# Patient Record
Sex: Male | Born: 1975 | Hispanic: No | Marital: Married | Smoking: Former smoker
Health system: Southern US, Community
[De-identification: ages and names within clinical notes are randomized; demographics above are authoritative.]

## PROBLEM LIST (undated history)

## (undated) DIAGNOSIS — R059 Cough, unspecified: Secondary | ICD-10-CM

## (undated) DIAGNOSIS — E785 Hyperlipidemia, unspecified: Secondary | ICD-10-CM

## (undated) DIAGNOSIS — E559 Vitamin D deficiency, unspecified: Secondary | ICD-10-CM

## (undated) DIAGNOSIS — R05 Cough: Secondary | ICD-10-CM

## (undated) HISTORY — DX: Cough: R05

## (undated) HISTORY — PX: CHOLECYSTECTOMY: SHX55

## (undated) HISTORY — DX: Hyperlipidemia, unspecified: E78.5

## (undated) HISTORY — PX: NOSE SURGERY: SHX723

## (undated) HISTORY — DX: Cough, unspecified: R05.9

## (undated) HISTORY — DX: Vitamin D deficiency, unspecified: E55.9

## (undated) HISTORY — PX: OTHER SURGICAL HISTORY: SHX169

---

## 2013-01-03 ENCOUNTER — Telehealth: Payer: Self-pay | Admitting: Critical Care Medicine

## 2013-01-03 NOTE — Telephone Encounter (Signed)
Dr. Delford Field, Dr. Susie Cassette would like to make an appt next wk for her husband in the [place] office with you. Did you want this appt with you because your aren't in [place] next week. If so, I can see if they can come the following week, if this is ok with you, as you will be in office then and openings available.

## 2013-01-03 NOTE — Telephone Encounter (Signed)
Called, spoke with Dr. Susie Cassette.  We have scheduled pt to see Dr. Delford Field on July 23 at 1:45 in [place].  She will ask him to arrive at 1:30.  Nothing further needed at this time.

## 2013-01-03 NOTE — Telephone Encounter (Signed)
Week after next is fine

## 2013-01-18 ENCOUNTER — Ambulatory Visit (INDEPENDENT_AMBULATORY_CARE_PROVIDER_SITE_OTHER)
Admission: RE | Admit: 2013-01-18 | Discharge: 2013-01-18 | Disposition: A | Discharge status: Home / Self Care | Payer: Managed Care, Other (non HMO) | Source: Ambulatory Visit | Attending: Critical Care Medicine | Admitting: Critical Care Medicine

## 2013-01-18 ENCOUNTER — Encounter: Payer: Self-pay | Admitting: Critical Care Medicine

## 2013-01-18 ENCOUNTER — Ambulatory Visit (INDEPENDENT_AMBULATORY_CARE_PROVIDER_SITE_OTHER): Payer: Managed Care, Other (non HMO) | Admitting: Critical Care Medicine

## 2013-01-18 VITALS — BP 106/60 | HR 57 | Temp 98.0°F | Ht 72.0 in | Wt 174.4 lb

## 2013-01-18 DIAGNOSIS — R05 Cough: Secondary | ICD-10-CM

## 2013-01-18 DIAGNOSIS — R059 Cough, unspecified: Secondary | ICD-10-CM

## 2013-01-18 MED ORDER — BENZONATATE 100 MG PO CAPS: ORAL_CAPSULE | ORAL | Status: AC

## 2013-01-18 MED ORDER — PREDNISONE 10 MG PO TABS: ORAL_TABLET | ORAL | Status: DC

## 2013-01-18 MED ORDER — CHLORPHENIRAMINE MALEATE 4 MG PO TABS: ORAL_TABLET | ORAL | Status: AC

## 2013-01-18 MED ORDER — HYDROCODONE-HOMATROPINE 5-1.5 MG/5ML PO SYRP: 5.0000 mL | ORAL_SOLUTION | Freq: Four times a day (QID) | ORAL | Status: AC | PRN

## 2013-01-18 MED ORDER — MOMETASONE FUROATE 50 MCG/ACT NA SUSP: 2.0000 | Freq: Every day | NASAL | Status: AC

## 2013-01-18 MED ORDER — OMEPRAZOLE 20 MG PO CPDR: 20.0000 mg | DELAYED_RELEASE_CAPSULE | Freq: Every day | ORAL | Status: DC

## 2013-01-18 NOTE — Progress Notes (Signed)
HPI Comments: Pt presents as new consult for chronic cough. States the cough began several years ago and is often associated with drinking cold drinks or ice and leads to episodes of productive cough with low grade fever.  He states he has burping and sore throat after eating. During these episodes he coughs a lot, has nasal drainage with yellow colored mucus, has chest congestion, and occational sneezing.  He states he has burping and sore throat after eating. Denies chest pain, tightness and SOB with cough. He has a PMH of tonsillectomy and nasal septum surgery and pertusis (2012). He has a history of smoking in college (one cigarette every 2-3 days) and tavels frequently and is on the phone a lot with his job. He has a family history of asthma (mother, brother), but denies cancer, allergic rhinitis, and pulmonary disease.     Cough This is a recurrent problem. The current episode started more than 1 year ago. The problem has been unchanged. The cough is non-productive (occ prod yellow mucus). Associated symptoms include ear congestion, nasal congestion, rhinorrhea and a sore throat. Pertinent negatives include no chest pain, chills, ear pain, fever, headaches, heartburn, hemoptysis, postnasal drip, rash, shortness of breath, sweats, weight loss or wheezing. Associated symptoms comments: Burping worse after meals.  . The symptoms are aggravated by other. Risk factors for lung disease include smoking/tobacco exposure and travel. He has tried OTC cough suppressant and rest for the symptoms. The treatment provided moderate relief. There is no history of asthma, bronchiectasis, bronchitis, COPD, emphysema, environmental allergies or pneumonia. zpak 1 month had fever/sore throat this made cough worse and zpak helped   Past Medical History  Diagnosis Date  . Hyperlipidemia   . Cough   . Vitamin D deficiency      Family History  Problem Relation Age of Onset  . Asthma Mother   . Heart disease Maternal  Grandfather      History   Social History  . Marital Status: Married    Spouse Name: N/A    Number of Children: N/A  . Years of Education: N/A   Occupational History  . Not on file.   Social History Main Topics  . Smoking status: Former Smoker -- 0.10 packs/day for 2 years    Types: Cigarettes    Quit date: 06/30/2007  . Smokeless tobacco: Never Used     Comment: social smoker x 2 years.  Would smoking 1-2 every couple days.  . Alcohol Use: Yes     Comment: 1-2 times per week  . Drug Use: Not on file  . Sexually Active: Not on file   Other Topics Concern  . Not on file   Social History Narrative  . No narrative on file     Allergies  Allergen Reactions  . Contrast Media (Iodinated Diagnostic Agents)     vomiting     No outpatient prescriptions prior to visit.   No facility-administered medications prior to visit.     Review of Systems  Constitutional: Negative for fever, chills and weight loss.  HENT: Positive for sore throat and rhinorrhea. Negative for ear pain and postnasal drip.   Respiratory: Positive for cough. Negative for hemoptysis, shortness of breath and wheezing.   Cardiovascular: Negative for chest pain.  Gastrointestinal: Negative for heartburn.  Skin: Negative for rash.  Allergic/Immunologic: Negative for environmental allergies.  Neurological: Negative for headaches.    Physical Exam BP 106/60  Pulse 57  Temp(Src) 98 F (36.7 C) (Oral)  Ht  6' (1.829 m)  Wt 174 lb 6.4 oz (79.107 kg)  BMI 23.65 kg/m2  SpO2 95%  GENERAL: The patient is a well-developed, well-nourished male in no apparent distress. He is alert and oriented x3. HEENT: Head is normocephalic and atraumatic. Extraocular muscles are intact. Pupils are equal, round, and reactive to light and accommodation. Nares bilateral erythema and edema, no purulence. Mouth is well hydrated and without lesions. Mucous membranes are moist. Posterior pharynx clear of any exudate or  lesions. ++post nasal drip, erythematous post pharynx NECK: Supple. No carotid bruits.  No lymphadenopathy or thyromegaly. LUNGS: Clear to auscultation. HEART: Regular rate and rhythm without murmur.  ABDOMEN: Soft, nontender, and nondistended.  Positive bowel sounds.  No hepatosplenomegaly was noted.  EXTREMITIES: Without any cyanosis, clubbing, rash, lesions or edema. No pitting edema present. NEUROLOGIC: Cranial nerves II through XII are grossly intact.  PSYCHIATRIC: normal affect SKIN: No rashes present  Dg Chest 2 View  01/18/2013   *RADIOLOGY REPORT*  Clinical Data: Chronic cough, shortness of breath, former smoker  CHEST - 2 VIEW  Comparison: None  Findings: Normal heart size, mediastinal contours, and pulmonary vascularity. Lungs appear minimally hyperinflated but clear. Question tiny calcified granuloma lower right lung. No pleural effusion or pneumothorax. Bones unremarkable.  IMPRESSION: No acute abnormalities.   Original Report Authenticated By: Ulyses Southward, M.D.    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 associated reflux disease with postnasal drip syndrome due to allergic rhinitis Doubt significant primary lung disease Plan Take prednisone 10 mg  4 a day and then down by one every 2 days till off  orally for tapered use, take with food Start the Gastroesophageal Reflux diet and omeprazole 20mg  before meal breakfast daily  Cough Protocol -  Take Hydromet 5ml three times a day for cough then benzonatate   For allergic rhinitis: Take  Chloriphenirimine (Chlor trimeton)  8 mg by mouth at bedtime for  allergic rhinitis  Take Nasonex 2 sprays in each nostril every day for allergic rhinitis   Return 1 month    Updated Medication List Outpatient Encounter Prescriptions as of 01/18/2013  Medication Sig Dispense Refill  . fenofibrate (TRICOR) 145 MG tablet Take 145 mg by mouth daily.      . benzonatate (TESSALON) 100 MG capsule 1-2 every 4 hours per  cough protocol  90 capsule  4  . chlorpheniramine (CHLOR-TRIMETON) 4 MG tablet Use 8mg  nightly  30 tablet  0  . HYDROcodone-homatropine (HYCODAN) 5-1.5 MG/5ML syrup Take 5 mLs by mouth every 6 (six) hours as needed for cough.  240 mL  0  . mometasone (NASONEX) 50 MCG/ACT nasal spray Place 2 sprays into the nose daily.  17 g  11  . omeprazole (PRILOSEC) 20 MG capsule Take 1 capsule (20 mg total) by mouth daily.  30 capsule  11  . predniSONE (DELTASONE) 10 MG tablet Take 4 for two days three for two days two for two days one for two days  20 tablet  0   No facility-administered encounter medications on file as of 01/18/2013.

## 2013-01-18 NOTE — Patient Instructions (Addendum)
Chest xray will call with results  Take prednisone 10 mg  4 a day and then down by one every 2 days till off  orally for tapered use, take with food Start the Gastroesophageal Reflux diet and omeprazole 20mg  before meal breakfast daily  Cough Protocol -  Take Hydromet 5ml three times a day for cough then benzonatate   For allergic rhinitis: Take  Chloriphenirimine (Chlor trimeton)  8 mg by mouth at bedtime for  allergic rhinitis  Take Nasonex 2 sprays in each nostril every day for allergic rhinitis   Return 1 month

## 2013-01-18 NOTE — Progress Notes (Signed)
  Subjective:    Patient ID: Ricky Larson, male    DOB: [birth year], 37 y.o.   MRN: [id number]  HPI    Review of Systems  Constitutional: Positive for fever. Negative for chills, diaphoresis, activity change, appetite change, fatigue and unexpected weight change.  HENT: Positive for ear pain, congestion, sore throat, rhinorrhea and sneezing. Negative for hearing loss, nosebleeds, facial swelling, mouth sores, trouble swallowing, neck pain, neck stiffness, dental problem, voice change, postnasal drip, sinus pressure, tinnitus and ear discharge.   Eyes: Negative for photophobia, discharge, itching and visual disturbance.  Respiratory: Positive for cough. Negative for apnea, choking, chest tightness, shortness of breath, wheezing and stridor.   Cardiovascular: Negative for chest pain, palpitations and leg swelling.  Gastrointestinal: Negative for nausea, vomiting, abdominal pain, constipation, blood in stool and abdominal distention.  Genitourinary: Positive for flank pain. Negative for dysuria, urgency, frequency, hematuria, decreased urine volume and difficulty urinating.  Musculoskeletal: Negative for myalgias, back pain, joint swelling, arthralgias and gait problem.  Skin: Negative for color change, pallor and rash.  Neurological: Positive for headaches. Negative for dizziness, tremors, seizures, syncope, speech difficulty, weakness, light-headedness and numbness.  Hematological: Negative for adenopathy. Does not bruise/bleed easily.  Psychiatric/Behavioral: Negative for confusion, sleep disturbance and agitation. The patient is not nervous/anxious.        Objective:   Physical Exam        Assessment & Plan:

## 2013-01-18 NOTE — Progress Notes (Signed)
Quick Note:  Called, spoke with pt's wife. Informed her of cxr results per Dr. Delford Field. She verbalized understanding. ______

## 2013-01-18 NOTE — Assessment & Plan Note (Signed)
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 associated reflux disease with postnasal drip syndrome due to allergic rhinitis Doubt significant primary lung disease Plan Take prednisone 10 mg  4 a day and then down by one every 2 days till off  orally for tapered use, take with food Start the Gastroesophageal Reflux diet and omeprazole 20mg  before meal breakfast daily  Cough Protocol -  Take Hydromet 5ml three times a day for cough then benzonatate   For allergic rhinitis: Take  Chloriphenirimine (Chlor trimeton)  8 mg by mouth at bedtime for  allergic rhinitis  Take Nasonex 2 sprays in each nostril every day for allergic rhinitis   Return 1 month

## 2013-01-18 NOTE — Progress Notes (Signed)
Quick Note:  Notify the patient that the Xray is stable and no pneumonia No change in medications are recommended. Continue current meds as prescribed at last office visit ______ 

## 2013-02-24 ENCOUNTER — Ambulatory Visit: Payer: Managed Care, Other (non HMO) | Admitting: Critical Care Medicine

## 2013-11-23 ENCOUNTER — Other Ambulatory Visit: Payer: Self-pay | Admitting: Internal Medicine

## 2013-11-23 LAB — LIPID PANEL
CHOL/HDL RATIO: 4.3 ratio
CHOLESTEROL: 195 mg/dL (ref 0–200)
HDL: 45 mg/dL (ref >39)
LDL Cholesterol: 128 mg/dL — ABNORMAL HIGH (ref 0–99)
TRIGLYCERIDES: 110 mg/dL (ref <150)
VLDL: 22 mg/dL (ref 0–40)

## 2013-11-23 LAB — COMPREHENSIVE METABOLIC PANEL
ALT: 74 U/L — ABNORMAL HIGH (ref 0–53)
AST: 38 U/L — ABNORMAL HIGH (ref 0–37)
Albumin: 4.7 g/dL (ref 3.5–5.2)
Alkaline Phosphatase: 72 U/L (ref 39–117)
BILIRUBIN TOTAL: 0.6 mg/dL (ref 0.2–1.2)
BUN: 13 mg/dL (ref 6–23)
CALCIUM: 9.3 mg/dL (ref 8.4–10.5)
CHLORIDE: 108 meq/L (ref 96–112)
CO2: 24 meq/L (ref 19–32)
CREATININE: 0.83 mg/dL (ref 0.50–1.35)
GLUCOSE: 89 mg/dL (ref 70–99)
Potassium: 4.4 mEq/L (ref 3.5–5.3)
Sodium: 140 mEq/L (ref 135–145)
TOTAL PROTEIN: 6.7 g/dL (ref 6.0–8.3)

## 2013-11-23 LAB — CBC
HCT: 44.4 % (ref 39.0–52.0)
HEMOGLOBIN: 15.3 g/dL (ref 13.0–17.0)
MCH: 26.5 pg (ref 26.0–34.0)
MCHC: 34.5 g/dL (ref 30.0–36.0)
MCV: 76.9 fL — AB (ref 78.0–100.0)
Platelets: 292 10*3/uL (ref 150–400)
RBC: 5.77 MIL/uL (ref 4.22–5.81)
RDW: 14.3 % (ref 11.5–15.5)
WBC: 5.6 10*3/uL (ref 4.0–10.5)

## 2013-11-23 LAB — HEMOGLOBIN A1C
HEMOGLOBIN A1C: 5.8 % — AB (ref <5.7)
Mean Plasma Glucose: 120 mg/dL — ABNORMAL HIGH (ref <117)

## 2013-11-24 LAB — ABO AND RH: Rh Type: POSITIVE

## 2013-11-24 LAB — VITAMIN B12: VITAMIN B 12: 346 pg/mL (ref 211–911)

## 2013-11-24 LAB — VITAMIN D 25 HYDROXY (VIT D DEFICIENCY, FRACTURES): VIT D 25 HYDROXY: 27 ng/mL — AB (ref 30–89)

## 2013-11-24 LAB — TSH: TSH: 3.312 u[IU]/mL (ref 0.350–4.500)

## 2014-01-09 ENCOUNTER — Encounter (HOSPITAL_COMMUNITY): Payer: Self-pay | Admitting: Emergency Medicine

## 2014-01-09 ENCOUNTER — Emergency Department (HOSPITAL_COMMUNITY)
Admission: EM | Admit: 2014-01-09 | Discharge: 2014-01-09 | Disposition: A | Discharge status: Home / Self Care | Payer: BC Managed Care – PPO | Source: Home / Self Care

## 2014-01-09 ENCOUNTER — Emergency Department (INDEPENDENT_AMBULATORY_CARE_PROVIDER_SITE_OTHER): Payer: BC Managed Care – PPO

## 2014-01-09 DIAGNOSIS — IMO0001 Reserved for inherently not codable concepts without codable children: Secondary | ICD-10-CM

## 2014-01-09 DIAGNOSIS — R059 Cough, unspecified: Secondary | ICD-10-CM

## 2014-01-09 DIAGNOSIS — R05 Cough: Secondary | ICD-10-CM

## 2014-01-09 DIAGNOSIS — M7918 Myalgia, other site: Secondary | ICD-10-CM

## 2014-01-09 LAB — POCT I-STAT, CHEM 8
BUN: 11 mg/dL (ref 6–23)
CREATININE: 1 mg/dL (ref 0.50–1.35)
Calcium, Ion: 1.18 mmol/L (ref 1.12–1.23)
Chloride: 107 mEq/L (ref 96–112)
Glucose, Bld: 77 mg/dL (ref 70–99)
HCT: 50 % (ref 39.0–52.0)
HEMOGLOBIN: 17 g/dL (ref 13.0–17.0)
POTASSIUM: 4.2 meq/L (ref 3.7–5.3)
SODIUM: 140 meq/L (ref 137–147)
TCO2: 25 mmol/L (ref 0–100)

## 2014-01-09 NOTE — Discharge Instructions (Signed)
Your chest pain is likely musculoskeletal in nature and is not related to your heart Try taking ibuprofen 600mg  every 6 hours for several days to see if it resolves the pain Please follow up with your regular doctor to get a ppd as well as a routine check up Please come back if there are any further concerns.  Have a great night.

## 2014-01-09 NOTE — ED Notes (Signed)
Pt c/o intermittent right side chest pain that last 2-3 minutes x 1 month yest has become more frequent; travels frequently due to his job Just came from [place] 2 weeks ago Sx also include: HA Denies weakness, diaphoresis Alert and talking in complete sentences w/no signs of acute distress.

## 2014-01-09 NOTE — ED Provider Notes (Signed)
CSN: [id number]     Arrival date & time 01/09/14  1904 History   None    Chief Complaint  Patient presents with  . Chest Pain   (Consider location/radiation/quality/duration/timing/severity/associated sxs/prior Treatment) HPI  CP: R sided. On and off for a couple of years. 3 occurances for the last day. Lasts for 2-3 min. 5/10 pain. Does not happen when asleep. Not tied to any particular activity - random. No alleviating factors. Currently taking tricor for cholesterol. Associated w/ HA. Denies radiation, diaphoresis, SOB, palpitations. 2 wks ago returned from a trip to [place]. Denies leg swelling.   Cough: ongoing. Off an on. Pt is prescribed a Zpack every couple of months w/ some benefit. Pt travels internationally on a very regular basis. No h/o TB exposure. Denies tobacco use, fevers, night sweats, unintentional wt loss.    Past Medical History  Diagnosis Date  . Hyperlipidemia   . Cough   . Vitamin D deficiency    Past Surgical History  Procedure Laterality Date  . Cholecystectomy    . Tonsilectomy    . Nose surgery     Family History  Problem Relation Age of Onset  . Asthma Mother   . Heart disease Maternal Grandfather    History  Substance Use Topics  . Smoking status: Former Smoker -- 0.10 packs/day for 2 years    Types: Cigarettes    Quit date: 06/30/2007  . Smokeless tobacco: Never Used     Comment: social smoker x 2 years.  Would smoking 1-2 every couple days.  . Alcohol Use: Yes     Comment: 1-2 times per week    Review of Systems Per HPI w/ all other systems negative Allergies  Contrast media  Home Medications   Prior to Admission medications   Medication Sig Start Date End Date Taking? Authorizing Provider  benzonatate (TESSALON) 100 MG capsule 1-2 every 4 hours per cough protocol 01/18/13   Storm Frisk, MD  chlorpheniramine (CHLOR-TRIMETON) 4 MG tablet Use 8mg  nightly 01/18/13   Storm Frisk, MD  fenofibrate (TRICOR) 145 MG tablet Take 145 mg  by mouth daily.    Historical Provider, MD  HYDROcodone-homatropine (HYCODAN) 5-1.5 MG/5ML syrup Take 5 mLs by mouth every 6 (six) hours as needed for cough. 01/18/13   Storm Frisk, MD  mometasone (NASONEX) 50 MCG/ACT nasal spray Place 2 sprays into the nose daily. 01/18/13   Storm Frisk, MD  omeprazole (PRILOSEC) 20 MG capsule Take 1 capsule (20 mg total) by mouth daily. 01/18/13   Storm Frisk, MD  predniSONE (DELTASONE) 10 MG tablet Take 4 for two days three for two days two for two days one for two days 01/18/13   Storm Frisk, MD   BP 105/74  Pulse 63  Temp(Src) 97.8 F (36.6 C) (Oral)  Resp 18  SpO2 100% Physical Exam  Constitutional: He appears well-developed and well-nourished. No distress.  HENT:  Head: Normocephalic and atraumatic.  Eyes: EOM are normal. Pupils are equal, round, and reactive to light.  Neck: Normal range of motion. Neck supple. No JVD present.  Cardiovascular: Normal rate, regular rhythm, normal heart sounds and intact distal pulses.  Exam reveals no gallop and no friction rub.   No murmur heard. Pulmonary/Chest: Effort normal and breath sounds normal. No stridor. No respiratory distress. He has no wheezes. He has no rales. He exhibits tenderness.  Chest wall ttp along the mid clavicular line of the 4-5th ribs  Abdominal: Soft. He exhibits  no distension.  Musculoskeletal: Normal range of motion. He exhibits tenderness. He exhibits no edema.  Neurological: He is alert. He exhibits normal muscle tone.  Skin: Skin is warm. No rash noted. He is not diaphoretic.  Psychiatric: He has a normal mood and affect. His behavior is normal. Judgment and thought content normal.    ED Course  Procedures (including critical care time) Labs Review Labs Reviewed  POCT I-STAT, CHEM 8    Imaging Review Dg Chest 2 View  01/09/2014   CLINICAL DATA:  Right-sided chest pain.  EXAM: CHEST  2 VIEW  COMPARISON:  None.  FINDINGS: The heart size and mediastinal  contours are within normal limits. Both lungs are clear. The visualized skeletal structures are unremarkable.  IMPRESSION: No active cardiopulmonary disease.   Electronically Signed   By: Britta MccreedySusan  Turner M.D.   On: 01/09/2014 21:05     MDM   1. Musculoskeletal pain   2. Cough    CP: No sign of cardiac etiology of pain and low risk factors. Likely MSK pain as reproducible on palpation. Unlikely DVT/PE given negative D-Dimer and no leg swelling or persistence of pain.  Start NSAIDs, stretching.  Cough: ongoing and chronic condition for pt. Likely some reactive airway component vs recurring infectious bronchitis given frequency of international travel and change of climates and viral exposures. Low likelyhood of malignancy or TB though pt to get ppd tomorrow at PCP. May be related to GERD adn should try PPI. Defer to PCP.   Precautions given and all questions answered   Shelly Flattenavid Ziyanna Tolin, MD Family Medicine 01/09/2014, 9:42 PM      Thora Lanceavid J Merrill, MD 01/09/14 2142

## 2014-01-10 ENCOUNTER — Ambulatory Visit: Payer: BC Managed Care – PPO | Attending: Cardiology | Admitting: Cardiology

## 2014-01-10 ENCOUNTER — Other Ambulatory Visit: Payer: Self-pay

## 2014-01-10 ENCOUNTER — Ambulatory Visit (HOSPITAL_COMMUNITY)
Admission: RE | Admit: 2014-01-10 | Discharge: 2014-01-10 | Disposition: A | Discharge status: Home / Self Care | Payer: BC Managed Care – PPO | Source: Ambulatory Visit | Attending: Cardiology | Admitting: Cardiology

## 2014-01-10 ENCOUNTER — Encounter: Payer: Self-pay | Admitting: Cardiology

## 2014-01-10 VITALS — BP 102/64 | HR 65 | Temp 98.2°F | Resp 16

## 2014-01-10 DIAGNOSIS — R079 Chest pain, unspecified: Secondary | ICD-10-CM | POA: Insufficient documentation

## 2014-01-10 DIAGNOSIS — Z87891 Personal history of nicotine dependence: Secondary | ICD-10-CM | POA: Insufficient documentation

## 2014-01-10 DIAGNOSIS — R059 Cough, unspecified: Secondary | ICD-10-CM | POA: Insufficient documentation

## 2014-01-10 DIAGNOSIS — R05 Cough: Secondary | ICD-10-CM | POA: Insufficient documentation

## 2014-01-10 DIAGNOSIS — E559 Vitamin D deficiency, unspecified: Secondary | ICD-10-CM | POA: Insufficient documentation

## 2014-01-10 DIAGNOSIS — E785 Hyperlipidemia, unspecified: Secondary | ICD-10-CM | POA: Insufficient documentation

## 2014-01-10 NOTE — Progress Notes (Signed)
HPI [name] is a 38 year old married [place] male who comes today referred by his wife Dr.Abrol for the evaluation of chest pain.  He has had intermittent chest discomfort over the last several years. It is well localized with 2 fingers over the right mid chest just above the nipple. It occurs spontaneously. It does not radiate and is not associated with any other symptoms. It is clearly not exertional.  He has had 2 episodes this discomfort over the last day or so. They're difficult to those listed above and have been associated with a slight dull headache over the left frontotemporal region. No nausea, vomiting, fever, recent insect bite or tick exposure. He has had a slight stiffness in the left side of his neck but that's resolved.  He exercises on a treadmill intermittently. He has no limitations and has no symptoms consistent with ischemic heart disease or angina.  He denies any trauma or recent unusual physical activity or muscle usage.  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 He has a history of diabetes without obesity and his family. He smoked minimally in the past. He is not overweight. There is no history of hypertension.  EKG at urgent care showed normal sinus rhythm with nonspecific ST changes. We repeated one today which shows identical findings.  Past Medical History  Diagnosis Date  . Hyperlipidemia   . Cough   . Vitamin D deficiency     Current Outpatient Prescriptions  Medication Sig Dispense Refill  . benzonatate (TESSALON) 100 MG capsule 1-2 every 4 hours per cough protocol  90 capsule  4  . chlorpheniramine (CHLOR-TRIMETON) 4 MG tablet Use 8mg  nightly  30 tablet  0  . fenofibrate (TRICOR) 145 MG tablet Take 145 mg by mouth daily.      Marland Kitchen HYDROcodone-homatropine (HYCODAN) 5-1.5 MG/5ML syrup Take 5 mLs by mouth every  6 (six) hours as needed for cough.  240 mL  0  . mometasone (NASONEX) 50 MCG/ACT nasal spray Place 2 sprays into the nose daily.  17 g  11  . omeprazole (PRILOSEC) 20 MG capsule Take 1 capsule (20 mg total) by mouth daily.  30 capsule  11  . predniSONE (DELTASONE) 10 MG tablet Take 4 for two days three for two days two for two days one for two days  20 tablet  0   No current facility-administered medications for this visit.    Allergies  Allergen Reactions  . Contrast Media [Iodinated Diagnostic Agents]     vomiting    Family History  Problem Relation Age of Onset  . Asthma Mother   . Heart disease Maternal Grandfather     History   Social History  . Marital Status: Married    Spouse Name: N/A    Number of Children: N/A  . Years of Education: N/A   Occupational History  . Not on file.   Social History Main Topics  . Smoking status: Former Smoker -- 0.10 packs/day for 2 years    Types: Cigarettes    Quit date: 06/30/2007  . Smokeless tobacco: Never Used     Comment: social smoker x 2 years.  Would smoking 1-2 every couple days.  . Alcohol Use: Yes     Comment: 1-2 times per week  . Drug Use: Not on file  . Sexual Activity: Not on file   Other Topics Concern  .  Not on file   Social History Narrative  . No narrative on file    ROS ALL NEGATIVE EXCEPT THOSE NOTED IN HPI  PE  General Appearance: well developed, well nourished in no acute distress HEENT: symmetrical face, PERRLA, good dentition  Neck: no JVD, thyromegaly, or adenopathy, trachea midline Chest: symmetric without deformity, no discrete tenderness Cardiac: PMI non-displaced, RRR, normal S1, S2, no gallop or murmur or rub Lung: clear to ausculation and percussion Vascular: all pulses full without bruits  Abdominal: nondistended, nontender, good bowel sounds, no HSM, no bruits Extremities: no cyanosis, clubbing or edema, no sign of DVT, no varicosities  Skin: normal color, no rashes Neuro: alert and  oriented x 3, non-focal Pysch: normal affect  EKG Normal sinus rhythm, nonspecific ST changes which or mostly inferior. BMET    Component Value Date/Time   NA 140 01/09/2014 2025   K 4.2 01/09/2014 2025   CL 107 01/09/2014 2025   CO2 24 11/23/2013 0901   GLUCOSE 77 01/09/2014 2025   BUN 11 01/09/2014 2025   CREATININE 1.00 01/09/2014 2025   CREATININE 0.83 11/23/2013 0901   CALCIUM 9.3 11/23/2013 0901    Lipid Panel     Component Value Date/Time   CHOL 195 11/23/2013 0901   TRIG 110 11/23/2013 0901   HDL 45 11/23/2013 0901   CHOLHDL 4.3 11/23/2013 0901   VLDL 22 11/23/2013 0901   LDLCALC 128* 11/23/2013 0901    CBC    Component Value Date/Time   WBC 5.6 11/23/2013 0901   RBC 5.77 11/23/2013 0901   HGB 17.0 01/09/2014 2025   HCT 50.0 01/09/2014 2025   PLT 292 11/23/2013 0901   MCV 76.9* 11/23/2013 0901   MCH 26.5 11/23/2013 0901   MCHC 34.5 11/23/2013 0901   RDW 14.3 11/23/2013 0901

## 2014-01-10 NOTE — Progress Notes (Signed)
Patient complain of right sided chest pain that started yesterday Was seen yesterday at the urgent care

## 2014-01-10 NOTE — Assessment & Plan Note (Signed)
This is clearly not cardiac by exam or history. It most likely is mostly musculoskeletal spasm or related to some stress. With the associated headache it could be the latter. Reassurance given with no further cardiac evaluation indicated at this time. I did review cardiac risk factors and encouraged him to exercise 3 hours or more week, watch carbohydrate rich foods especially rice which he enjoys, and to maintain as close to the ideal body weight as possible.

## 2014-01-18 ENCOUNTER — Other Ambulatory Visit: Payer: Self-pay | Admitting: Internal Medicine

## 2014-01-18 MED ORDER — TRAZODONE HCL 50 MG PO TABS: 50.0000 mg | ORAL_TABLET | Freq: Every evening | ORAL | Status: DC | PRN

## 2014-02-20 ENCOUNTER — Other Ambulatory Visit: Payer: Self-pay | Admitting: Internal Medicine

## 2014-02-20 MED ORDER — LOSARTAN POTASSIUM 50 MG PO TABS: 50.0000 mg | ORAL_TABLET | Freq: Two times a day (BID) | ORAL | Status: DC

## 2014-02-20 MED ORDER — FENOFIBRATE 145 MG PO TABS: 145.0000 mg | ORAL_TABLET | Freq: Every day | ORAL | Status: DC

## 2014-02-20 MED ORDER — CLOPIDOGREL BISULFATE 75 MG PO TABS: 75.0000 mg | ORAL_TABLET | Freq: Every day | ORAL | Status: DC

## 2014-02-20 MED ORDER — CARVEDILOL 12.5 MG PO TABS: 12.5000 mg | ORAL_TABLET | Freq: Two times a day (BID) | ORAL | Status: DC

## 2014-02-20 MED ORDER — OMEPRAZOLE 40 MG PO CPDR: 40.0000 mg | DELAYED_RELEASE_CAPSULE | Freq: Every day | ORAL | Status: DC

## 2014-02-20 MED ORDER — AMLODIPINE BESYLATE 5 MG PO TABS: 5.0000 mg | ORAL_TABLET | Freq: Every day | ORAL | Status: DC

## 2014-02-20 MED ORDER — ATORVASTATIN CALCIUM 20 MG PO TABS: 20.0000 mg | ORAL_TABLET | Freq: Every day | ORAL | Status: AC

## 2014-02-20 MED ORDER — METFORMIN HCL 850 MG PO TABS: 850.0000 mg | ORAL_TABLET | Freq: Two times a day (BID) | ORAL | Status: DC

## 2014-02-20 MED ORDER — ATORVASTATIN CALCIUM 20 MG PO TABS: 20.0000 mg | ORAL_TABLET | Freq: Every day | ORAL | Status: DC

## 2014-02-20 MED ORDER — METFORMIN HCL 850 MG PO TABS: 850.0000 mg | ORAL_TABLET | Freq: Two times a day (BID) | ORAL | Status: AC

## 2014-02-21 ENCOUNTER — Other Ambulatory Visit: Payer: Self-pay | Admitting: Otolaryngology

## 2014-02-21 DIAGNOSIS — R05 Cough: Secondary | ICD-10-CM

## 2014-02-21 DIAGNOSIS — R1310 Dysphagia, unspecified: Secondary | ICD-10-CM

## 2014-02-21 DIAGNOSIS — K219 Gastro-esophageal reflux disease without esophagitis: Secondary | ICD-10-CM

## 2014-02-21 DIAGNOSIS — R059 Cough, unspecified: Secondary | ICD-10-CM

## 2014-02-21 DIAGNOSIS — F458 Other somatoform disorders: Secondary | ICD-10-CM

## 2014-03-06 ENCOUNTER — Other Ambulatory Visit: Payer: Self-pay | Admitting: Otolaryngology

## 2014-03-06 ENCOUNTER — Ambulatory Visit
Admission: RE | Admit: 2014-03-06 | Discharge: 2014-03-06 | Disposition: A | Discharge status: Home / Self Care | Payer: BC Managed Care – PPO | Source: Ambulatory Visit | Attending: Otolaryngology | Admitting: Otolaryngology

## 2014-03-06 DIAGNOSIS — R059 Cough, unspecified: Secondary | ICD-10-CM

## 2014-03-06 DIAGNOSIS — K219 Gastro-esophageal reflux disease without esophagitis: Secondary | ICD-10-CM

## 2014-03-06 DIAGNOSIS — R05 Cough: Secondary | ICD-10-CM

## 2014-03-06 DIAGNOSIS — F458 Other somatoform disorders: Secondary | ICD-10-CM

## 2014-03-06 DIAGNOSIS — R1310 Dysphagia, unspecified: Secondary | ICD-10-CM

## 2014-03-18 ENCOUNTER — Other Ambulatory Visit: Payer: Self-pay | Admitting: Internal Medicine

## 2014-03-27 ENCOUNTER — Other Ambulatory Visit: Payer: Self-pay | Admitting: Internal Medicine

## 2014-04-04 ENCOUNTER — Other Ambulatory Visit: Payer: Self-pay | Admitting: Internal Medicine

## 2014-06-12 ENCOUNTER — Other Ambulatory Visit: Payer: Self-pay | Admitting: Internal Medicine

## 2014-06-12 MED ORDER — PREDNISONE 20 MG PO TABS: ORAL_TABLET | ORAL | Status: AC

## 2014-11-05 IMAGING — CR DG CHEST 2V
2 series · 2 of 2 positions shown · non-contrast
Comparison: None

CLINICAL DATA: Chronic cough, shortness of breath, former smoker

CHEST - 2 VIEW

[view not recorded (1 of 2)]
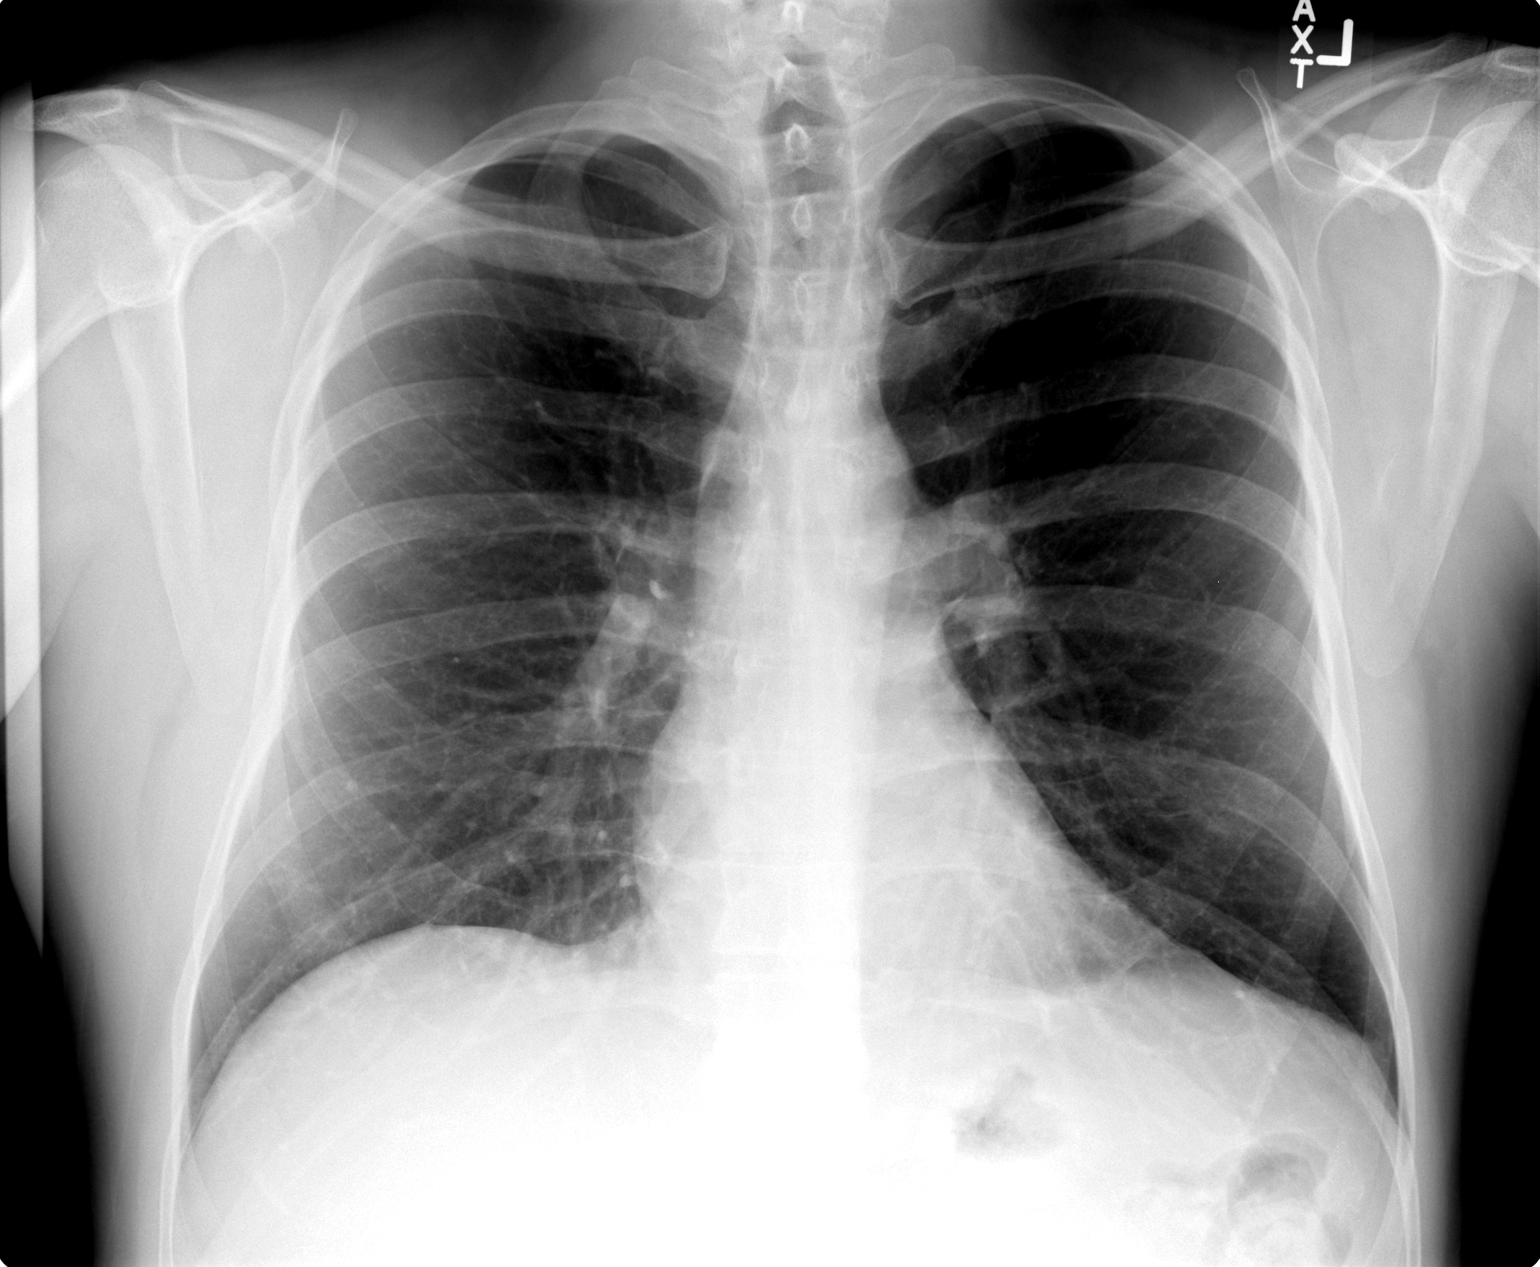

[view not recorded (2 of 2)]
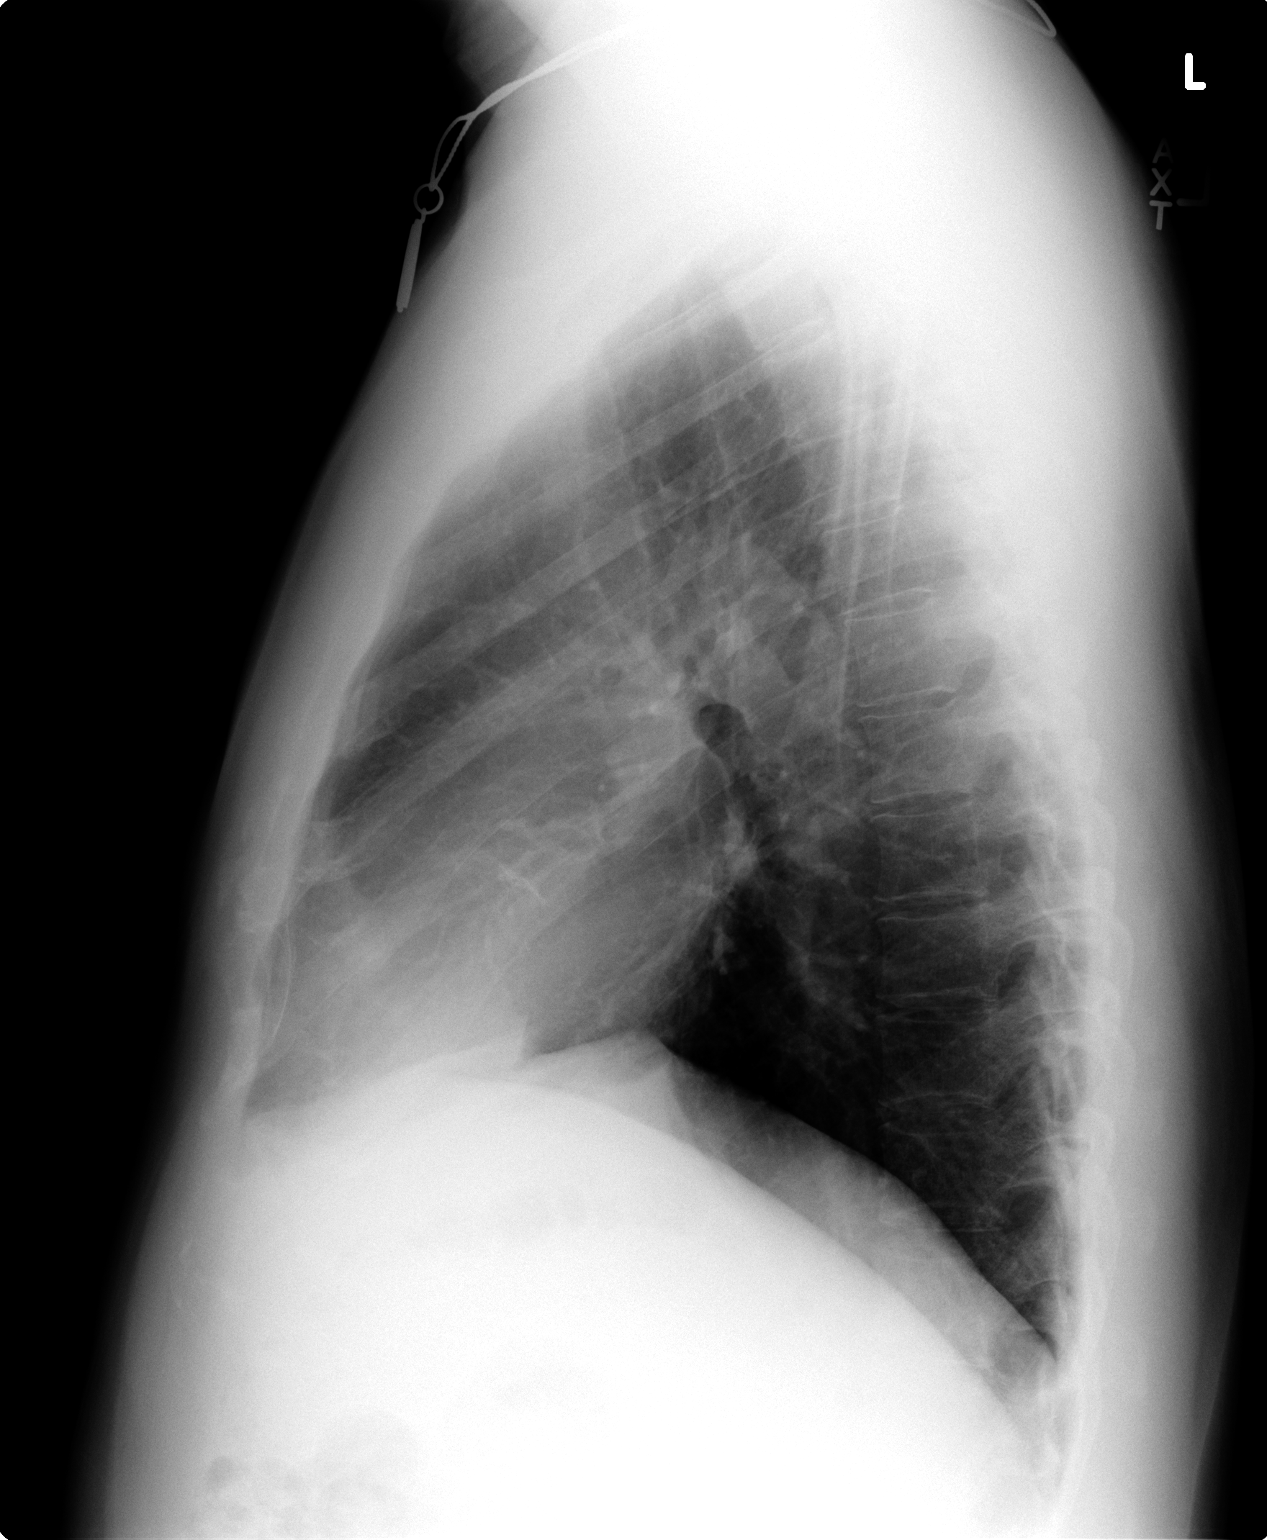

[2 of 2 positions shown; findings below may reference images not displayed]

FINDINGS: Normal heart size, mediastinal contours, and pulmonary vascularity.
Lungs appear minimally hyperinflated but clear.
Question tiny calcified granuloma lower right lung.
No pleural effusion or pneumothorax.
Bones unremarkable.
IMPRESSION: No acute abnormalities.

## 2014-12-27 ENCOUNTER — Other Ambulatory Visit: Payer: Self-pay | Admitting: Internal Medicine

## 2014-12-27 MED ORDER — PANTOPRAZOLE SODIUM 40 MG PO TBEC: 40.0000 mg | DELAYED_RELEASE_TABLET | Freq: Two times a day (BID) | ORAL | Status: AC

## 2014-12-27 MED ORDER — SERTRALINE HCL 25 MG PO TABS
25.0000 mg | ORAL_TABLET | Freq: Every day | ORAL | Status: AC
Start: 2014-12-27 — End: 2015-12-27

## 2014-12-27 MED ORDER — LEVOTHYROXINE SODIUM 100 MCG PO TABS: 100.0000 ug | ORAL_TABLET | Freq: Every day | ORAL | Status: AC

## 2015-07-19 ENCOUNTER — Other Ambulatory Visit: Payer: Self-pay | Admitting: Internal Medicine

## 2015-10-27 IMAGING — CR DG CHEST 2V
2 series · 2 of 2 positions shown · non-contrast
Comparison: None.

CLINICAL DATA: Right-sided chest pain.

EXAM:
CHEST  2 VIEW

[view not recorded (1 of 2)]
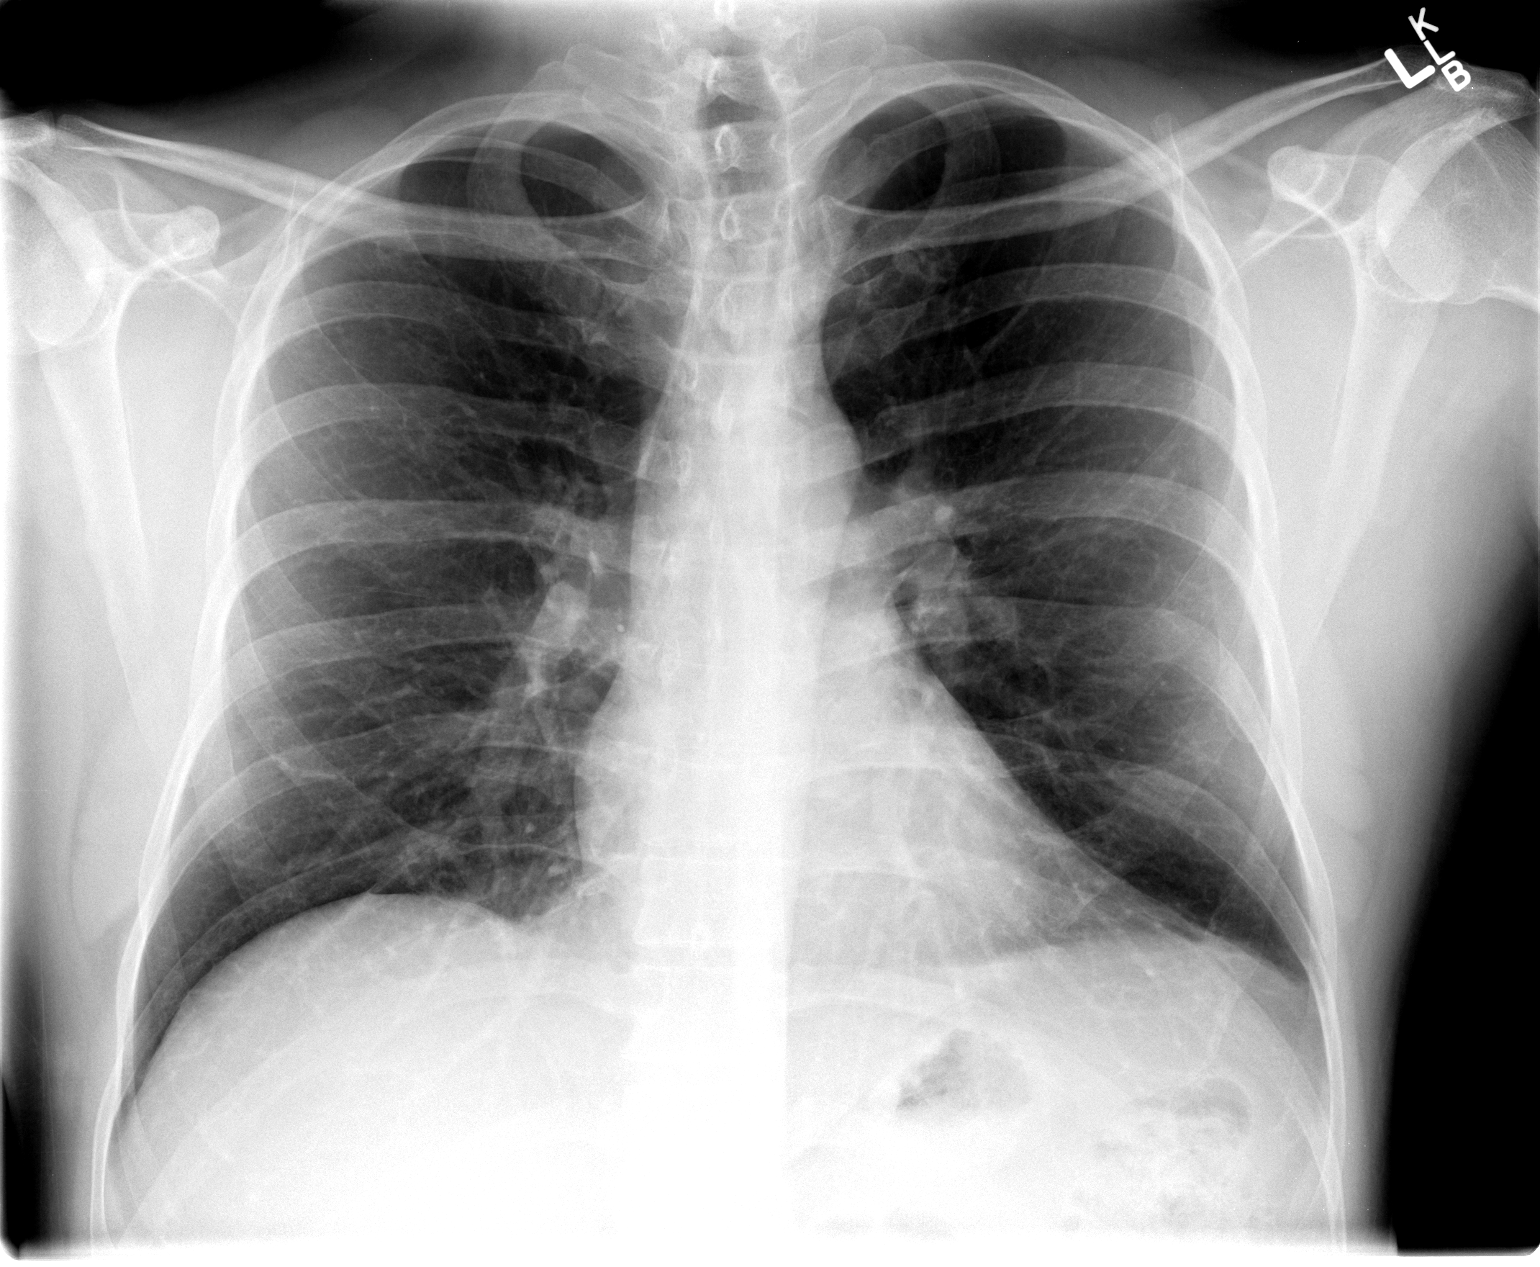

[view not recorded (2 of 2)]
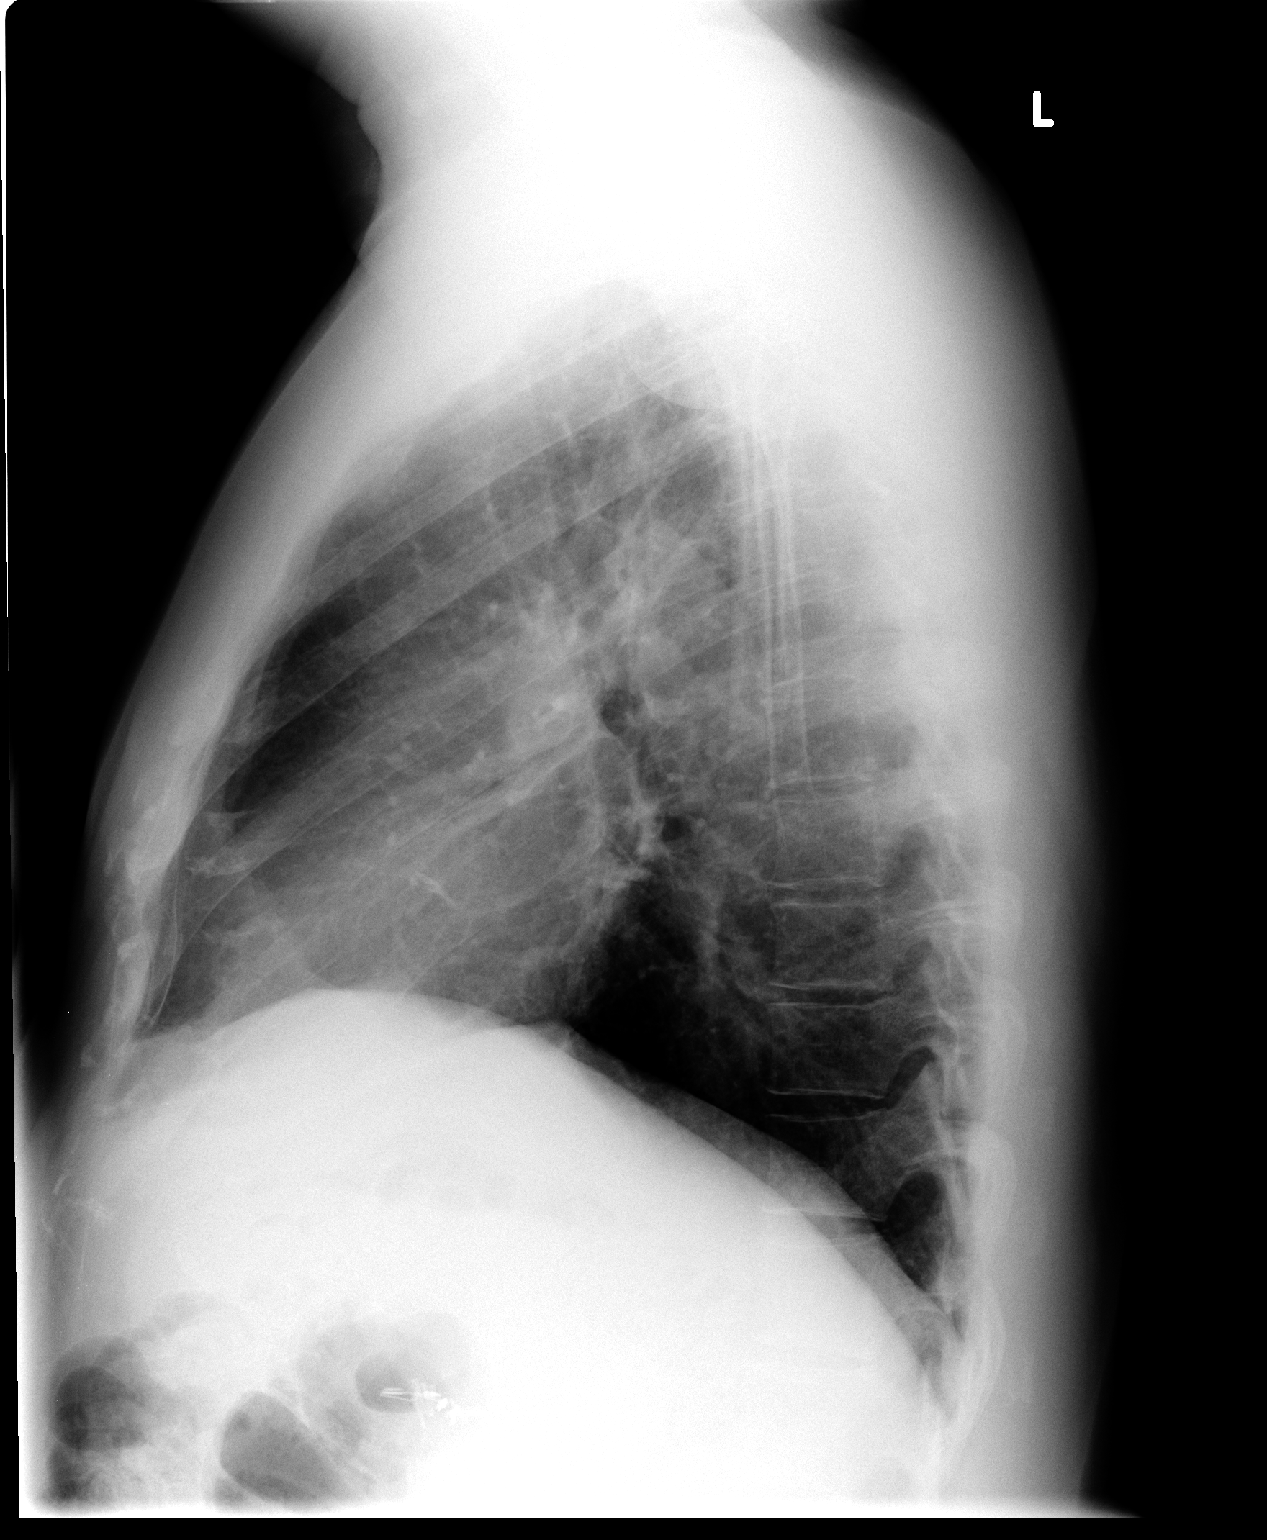

[2 of 2 positions shown; findings below may reference images not displayed]

FINDINGS: The heart size and mediastinal contours are within normal limits.
Both lungs are clear. The visualized skeletal structures are
unremarkable.
IMPRESSION: No active cardiopulmonary disease.

## 2016-02-17 ENCOUNTER — Other Ambulatory Visit: Payer: Self-pay | Admitting: Internal Medicine

## 2016-03-19 ENCOUNTER — Other Ambulatory Visit: Payer: Self-pay | Admitting: Internal Medicine

## 2016-03-19 LAB — LIPID PANEL
CHOL/HDL RATIO: 5.5 ratio — AB (ref <=5.0)
Cholesterol: 226 mg/dL — ABNORMAL HIGH (ref 125–200)
HDL: 41 mg/dL (ref >=40)
LDL CALC: 156 mg/dL — AB (ref <130)
Triglycerides: 145 mg/dL (ref <150)
VLDL: 29 mg/dL (ref <30)

## 2016-03-19 LAB — COMPREHENSIVE METABOLIC PANEL
ALT: 134 U/L — ABNORMAL HIGH (ref 9–46)
AST: 59 U/L — AB (ref 10–40)
Albumin: 4.6 g/dL (ref 3.6–5.1)
Alkaline Phosphatase: 69 U/L (ref 40–115)
BILIRUBIN TOTAL: 0.7 mg/dL (ref 0.2–1.2)
BUN: 11 mg/dL (ref 7–25)
CALCIUM: 9.1 mg/dL (ref 8.6–10.3)
CO2: 21 mmol/L (ref 20–31)
Chloride: 107 mmol/L (ref 98–110)
Creat: 0.85 mg/dL (ref 0.60–1.35)
GLUCOSE: 78 mg/dL (ref 65–99)
POTASSIUM: 4.6 mmol/L (ref 3.5–5.3)
Sodium: 141 mmol/L (ref 135–146)
Total Protein: 6.7 g/dL (ref 6.1–8.1)

## 2016-03-19 LAB — CBC
HEMATOCRIT: 47 % (ref 38.5–50.0)
Hemoglobin: 15.6 g/dL (ref 13.2–17.1)
MCH: 26.5 pg — ABNORMAL LOW (ref 27.0–33.0)
MCHC: 33.2 g/dL (ref 32.0–36.0)
MCV: 79.8 fL — ABNORMAL LOW (ref 80.0–100.0)
MPV: 8.6 fL (ref 7.5–12.5)
Platelets: 243 10*3/uL (ref 140–400)
RBC: 5.89 MIL/uL — ABNORMAL HIGH (ref 4.20–5.80)
RDW: 13.5 % (ref 11.0–15.0)
WBC: 6.6 10*3/uL (ref 3.8–10.8)

## 2016-03-19 LAB — PSA: PSA: 0.5 ng/mL (ref <=4.0)

## 2016-03-19 LAB — TSH: TSH: 2.07 m[IU]/L (ref 0.40–4.50)

## 2016-03-19 LAB — VITAMIN B12: Vitamin B-12: 461 pg/mL (ref 200–1100)

## 2016-03-20 LAB — HEMOGLOBIN A1C
Hgb A1c MFr Bld: 5.5 % (ref <5.7)
Mean Plasma Glucose: 111 mg/dL

## 2016-03-20 LAB — URINALYSIS, COMPLETE
BACTERIA UA: NONE SEEN [HPF]
BILIRUBIN URINE: NEGATIVE
CRYSTALS: NONE SEEN [HPF]
Casts: NONE SEEN [LPF]
Glucose, UA: NEGATIVE
Hgb urine dipstick: NEGATIVE
KETONES UR: NEGATIVE
Leukocytes, UA: NEGATIVE
Nitrite: NEGATIVE
Protein, ur: NEGATIVE
SPECIFIC GRAVITY, URINE: 1.029 (ref 1.001–1.035)
SQUAMOUS EPITHELIAL / LPF: NONE SEEN [HPF] (ref <=5)
WBC UA: NONE SEEN WBC/HPF (ref <=5)
Yeast: NONE SEEN [HPF]
pH: 6 (ref 5.0–8.0)

## 2016-03-20 LAB — VITAMIN D 25 HYDROXY (VIT D DEFICIENCY, FRACTURES): Vit D, 25-Hydroxy: 32 ng/mL (ref 30–100)

## 2016-06-27 ENCOUNTER — Other Ambulatory Visit: Payer: Self-pay | Admitting: Internal Medicine
